# Patient Record
Sex: Female | Born: 2000 | Race: White | Hispanic: No | Marital: Single | Smoking: Never smoker
Health system: Southern US, Community
[De-identification: ages and names within clinical notes are randomized; demographics above are authoritative.]

## PROBLEM LIST (undated history)

## (undated) DIAGNOSIS — K08409 Partial loss of teeth, unspecified cause, unspecified class: Secondary | ICD-10-CM

## (undated) DIAGNOSIS — R5383 Other fatigue: Secondary | ICD-10-CM

## (undated) DIAGNOSIS — F419 Anxiety disorder, unspecified: Secondary | ICD-10-CM

## (undated) DIAGNOSIS — N920 Excessive and frequent menstruation with regular cycle: Secondary | ICD-10-CM

## (undated) DIAGNOSIS — R010 Benign and innocent cardiac murmurs: Secondary | ICD-10-CM

## (undated) DIAGNOSIS — K30 Functional dyspepsia: Secondary | ICD-10-CM

## (undated) HISTORY — DX: Anxiety disorder, unspecified: F41.9

## (undated) HISTORY — DX: Functional dyspepsia: K30

## (undated) HISTORY — PX: IMPACTED THIRD MOLAR REMOVAL: SHX1790

## (undated) HISTORY — DX: Excessive and frequent menstruation with regular cycle: N92.0

## (undated) HISTORY — DX: Benign and innocent cardiac murmurs: R01.0

## (undated) HISTORY — PX: INTRAUTERINE DEVICE (IUD) INSERTION: SHX5877

## (undated) HISTORY — DX: Partial loss of teeth, unspecified cause, unspecified class: K08.409

## (undated) HISTORY — DX: Other fatigue: R53.83

---
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
## 2019-03-03 ENCOUNTER — Ambulatory Visit: Payer: BC Managed Care – PPO | Admitting: Psychiatry

## 2019-05-11 ENCOUNTER — Encounter: Payer: Self-pay | Admitting: Psychiatry

## 2019-05-11 ENCOUNTER — Other Ambulatory Visit: Payer: Self-pay

## 2019-05-11 ENCOUNTER — Ambulatory Visit (INDEPENDENT_AMBULATORY_CARE_PROVIDER_SITE_OTHER): Payer: BC Managed Care – PPO | Admitting: Psychiatry

## 2019-05-11 DIAGNOSIS — F410 Panic disorder [episodic paroxysmal anxiety] without agoraphobia: Secondary | ICD-10-CM | POA: Diagnosis not present

## 2019-05-11 MED ORDER — ALPRAZOLAM 0.5 MG PO TABS: 0.5000 mg | ORAL_TABLET | Freq: Three times a day (TID) | ORAL | 0 refills | Status: DC | PRN

## 2019-05-11 MED ORDER — ESCITALOPRAM OXALATE 5 MG PO TABS: 5.0000 mg | ORAL_TABLET | Freq: Every day | ORAL | 2 refills | Status: DC

## 2019-05-11 NOTE — Progress Notes (Signed)
[facility] MD/PA/NP Initial Note  05/11/2019 8:00 AM [name]  MRN:  [id number] PCP:  Marcene Corning, MD Time Spent:  60 minutes from 0800 to 0900  Chief Complaint:  Chief Complaint    Panic Attack; Anxiety      HPI: Renee Francis is seen onsite in office 60 minutes face-to-face individually with consent with epic collateral for psychiatric diagnostic evaluation with medical services for anxiety disrupting and interrupting her last 5 months of sophomore year of college.  The patient clarifies that she has been treated by a psychiatrist at [facility] but suggests she did not establish therapeutic alliance so she is not confident about BuSpar medication 5 mg 3 times daily, past Prozac 10 mg daily at one time, and Klonopin 0.5 mg twice daily as needed.  The patient does not know the diagnosis and worries about side effects or habituation to use so that she minimizes her medications.  She stopped BuSpar in her fear and may take Klonopin once weekly despite having almost daily out-of-control anxiety.  Mother sees Robley Fries, PhD for therapy in this office and may have taken medication for her anxiety.  Older brother age 12 years is on medication for his anxiety, though patient knows no specifics about either of these 2 relations.  She manifests the most recruitment for her description of having no control over feeling out of control even as she describes interepisodic normality except for an apprehension that she must avoid certain reminders or associations for her last anxiety episodes.  Anxiety has recurred since July recruiting intensity and triggers as well as consequences.  She now feels a sense of combined systems involvement starting somatically as nauseous queasy stomach as though she could lose control and vomit or other excretion.  She may then experience cardiac rate acceleration, and diffuse dissonance as though confused and liable to syncope with thoughts that she could die from the process  suggesting vestibular extension.  She denies triggering trauma or stressors though the themes are generally those of past somatic symptom foci.  She self talks but has no therapist..  Shee denies psychiic or medical trauma in the past.  She has no social anxiety stating she is generally an extrovert except when she experiences these spells of anxiety which seem to be pre or fully established panic.  She does not worry when with family in familiar and secure settings, and she does not describe have generalized anxiety though BuSpar would target such.  She does not describe OCD or other specific phobia.  [place] registry documents 1 fill of Klonopin 03/01/2019.  She has used small amounts of alcohol and caffeine without excess, consequences, mania, delirium, suicidality, or psychosis.  Visit Diagnosis:    ICD-10-CM   1. Panic disorder  F41.0 ALPRAZolam (XANAX) 0.5 MG tablet    escitalopram (LEXAPRO) 5 MG tablet    Past Psychiatric History: Psychiatrist at [facility] apparently prescribing Prozac 10 mg, BuSpar 5 mg, and Klonopin 0.5 mg.  Past Medical History:  Past Medical History:  Diagnosis Date  . Anxiety   . Dyspepsia due to dysmotility   . Fatigue   . Functional heart murmur   . Hypermenorrhea   . Wisdom teeth removed     Past Surgical History:  Procedure Laterality Date  . IMPACTED THIRD MOLAR REMOVAL    . INTRAUTERINE DEVICE (IUD) INSERTION    . LABIAL REDUCTION  2019    Family Psychiatric History: Mother and 29 year old brother have anxiety likely needing medication and mother is in  therapy in this office with PhD.  Family History:  Family History  Problem Relation Age of Onset  . Anxiety disorder Mother   . Anxiety disorder Brother     Social History:  Social History   Socioeconomic History  . Marital status: Single    Spouse name: Not on file  . Number of children: Not on file  . Years of education: Not on file  . Highest education level: Some college, no degree   Occupational History  . Occupation: Consulting civil engineer  Tobacco Use  . Smoking status: Never Smoker  . Smokeless tobacco: Never Used  Substance and Sexual Activity  . Alcohol use: Not Currently  . Drug use: Never  . Sexual activity: Never    Birth control/protection: Pill, I.U.D.  Other Topics Concern  . 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  Her time-limited panic disorder since August is interfering with social and academic life as she is unable to go places or take part in activities that have no bathroom facility available or to be experiencing a sense of loss of control such as riding in the car with other people.  She drinks coffee daily but abstains when feeling prone to panic as they have in her past though she also has heart racing needing self talk feeling lightheaded with thoughts of dying about to get sick and pass out.  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  She has had GI consultation, cardiology consultation for benign heart murmur and venous hum, wisdom teeth extraction or removal, and IUD insertion and check with exam under anesthesia including labial reconstruction.  She tolerated anesthesia well has no substance exposure other than's small amounts of alcohol.  Mother has psychotherapy and possibly medication for anxiety disorders not knowing specifics similar to brother on anxiety medications at age 30 years.   Social Determinants of Health   Financial Resource Strain:   . Difficulty of Paying Living Expenses: Not on file  Food Insecurity:   . Worried About Programme researcher, broadcasting/film/video in the Last Year: Not on file  . Ran Out of Food in the Last Year: Not on file  Transportation Needs:   . Lack of Transportation (Medical): Not on file  . Lack of Transportation (Non-Medical): Not on file  Physical Activity:   .  Days of Exercise per Week: Not on file  . Minutes of Exercise per Session: Not on file  Stress:   . Feeling of Stress : Not on file  Social Connections:   . Frequency of Communication with Friends and Family: Not on file  . Frequency of Social Gatherings with Friends and Family: Not on file  . Attends Religious Services: Not on file  . Active Member of Clubs or Organizations: Not on file  . Attends Banker Meetings: Not on file  . Marital Status: Not on file    Allergies:  Allergies  Allergen Reactions  . Amoxicillin     Metabolic Disorder Labs: No results found for: HGBA1C, MPG No results found for: PROLACTIN No results found for: CHOL, TRIG, HDL, CHOLHDL, VLDL, LDLCALC No results found for: TSH  Therapeutic Level Labs: No results found for: LITHIUM No results found for: VALPROATE No components found for:  CBMZ  Current Medications: Current Outpatient Medications  Medication Sig Dispense Refill  . ALPRAZolam (XANAX) 0.5 MG  tablet Take 1 tablet (0.5 mg total) by mouth 3 (three) times daily as needed for anxiety (Panic). 60 tablet 0  . escitalopram (LEXAPRO) 5 MG tablet Take 1 tablet (5 mg total) by mouth at bedtime. 30 tablet 2  . ibuprofen (ADVIL,MOTRIN) 800 MG tablet Take 800 mg by mouth every 8 (eight) hours as needed.     No current facility-administered medications for this visit.    Medication Side Effects: none  Orders placed this visit:  No orders of the defined types were placed in this encounter.   Psychiatric Specialty Exam:  Review of Systems  Constitutional: Positive for activity change, appetite change and fatigue.  HENT: Positive for dental problem.        Wisdom teeth removed under anesthesia  Eyes: Negative.   Respiratory: Positive for shortness of breath.   Cardiovascular: Positive for chest pain.  Gastrointestinal: Positive for diarrhea and nausea.       Dyspepsia likely associated with dysmotility  Genitourinary: Positive for  menstrual problem.       Kyleena IUD caused excessive menorrhagia requiring birth control pill now with both, no other specific problems reported at puberty.  Musculoskeletal: Negative.   Skin:       Overall topical and enteral meds for acne.  Neurological: Positive for dizziness, weakness, light-headedness and numbness.  Hematological: Negative.   Psychiatric/Behavioral: Positive for confusion, decreased concentration and sleep disturbance. Negative for agitation, behavioral problems, dysphoric mood, hallucinations, self-injury and suicidal ideas. The patient is nervous/anxious. The patient is not hyperactive.     Blood pressure 121/79, pulse 71, height 5\' 5"  (1.651 m), weight 117 lb (53.1 kg).Body mass index is 19.47 kg/m.  Full range of motion cervical spine with no craniofacial dysmorphia.  No neurologic soft signs are evident, and there are no neurocutaneous stigmata.Muscle strengths and tone 5/5, postural reflexes and gait 0/0, and AIMS = 0.  Vestibular functions are intact.  PERRLA 4 mm with EOMs intact.  General Appearance: Casual, Fairly Groomed and Guarded  Eye Contact:  Fair  Speech:  Clear and Coherent, Normal Rate and Talkative  Volume:  Normal  Mood:  Anxious and Euthymic  Affect:  Congruent, Inappropriate, Full Range and Anxious  Thought Process:  Coherent, Goal Directed, Irrelevant and Descriptions of Associations: Tangential  Orientation: Full orienting x4 to person place time and situation  Thought Content: Ilusions, Rumination and Tangential   Suicidal Thoughts:  No  Homicidal Thoughts:  No  Memory:  Immediate;   Good Remote;   Good  Judgement:  Fair  Insight:  Fair  Psychomotor Activity:  Normal, Increased and Mannerisms  Concentration:  Concentration: Fair and Attention Span: Good  Recall:  Good  Fund of Knowledge: Good  Language: Good  Assets:  Desire for Improvement Leisure Time Resilience Vocational/Educational  ADL's:  Intact  Cognition: WNL  Prognosis:   Good   Screenings: Mood disorder questionnaire endorses 3 of 13 items proximate in time serious and problem endorsing that she is at times unable to sleep without really missing it, thoughts racing so she could not slow her mind overthinking, and she became distracted so that concentration was difficult with no manic diathesis or family history  Receiving Psychotherapy: No   Treatment Plan/Recommendations: Psychosupportive psychoeducation mobilizes over 50% of the 60-minute face-to-face time as total 30 minutes spent in counseling and coordination of care with exposure desensitization thought stopping reflex reversal response prevention for behavioral nutrition for weight restoration, sleep hygiene, object relatedness, and desperation management.  She is provided  prevention and monitoring and safety hygiene with warnings and risk for diagnoses and treatment including medications overcoming phobic avoidance to gain confident application including with medication symptom treatment matching.  She discontinues the Klonopin and starts Xanax 0.5 mg 3 times daily as needed for panic or high anxiety of prepanic scribe's #60 with no refill to [facility] [address] for panic disorder.  She agrees to not restart BuSpar but she allows eScription of Lexapro 5 mg every bedtime sent as #30 with 2 refill that will need further titration for panic disorder scribed to [facility] [address].  She returns in 2 to 3 weeks before return to [facility] [employer] preparing for return today and to implement interim accomplishments on return including follow-up as necessary.  Education on therapy is provided which [facility] [employer].    Chauncey MannGlenn E Kavontae Pritchard, MD

## 2019-05-26 ENCOUNTER — Ambulatory Visit (INDEPENDENT_AMBULATORY_CARE_PROVIDER_SITE_OTHER): Payer: BC Managed Care – PPO | Admitting: Psychiatry

## 2019-05-26 ENCOUNTER — Encounter: Payer: Self-pay | Admitting: Psychiatry

## 2019-05-26 ENCOUNTER — Other Ambulatory Visit: Payer: Self-pay

## 2019-05-26 VITALS — Ht 65.0 in | Wt 119.0 lb

## 2019-05-26 DIAGNOSIS — F410 Panic disorder [episodic paroxysmal anxiety] without agoraphobia: Secondary | ICD-10-CM

## 2019-05-26 MED ORDER — ESCITALOPRAM OXALATE 5 MG PO TABS: 5.0000 mg | ORAL_TABLET | Freq: Every day | ORAL | 0 refills | Status: DC

## 2019-05-26 NOTE — Progress Notes (Signed)
[facility]  Patient ID: Renee Francis,  MRN: [id number]  PCP: Lodema Pilot, MD  Date of Evaluation: 05/26/2019 Time spent:20 minutes from 1040 to 1100  Chief Complaint:  Chief Complaint    Panic Attack; Anxiety      HISTORY/CURRENT STATUS: Vale is seen onsite in office 20 minutes face-to-face with consent with epic collateral individually for psychiatric interview and exam in 2-week evaluation and management of panic disorder.  Patient has had approximately 2 panic attacks weekly taking Xanax 0.5 mg sometimes repeating in an hour overall using approximately 5 tablets weekly therefore anticipating she may have 12 weeks of coverage with current supply.  She is taking the Lexapro 5 mg nightly finding the medication to gradually be helping more than previous BuSpar or Prozac but not fully established yet.  She declines to advance to the 10 mg nightly.  She did not talk to her brother about his anxiety over the holiday.  Mother is sad the patient is returning to [place] early but the family otherwise did well at the holiday.  She has no adverse effects otherwise.  She inquires about telemedicine if needed and her desire not to have to use a psychiatric services at [facility].  She discusses the Lexapro dosing times options for her schedule at college. [place] registry documents Xanax dispensing 05/11/2019. She has no mania, suicidality, psychosis or delirium.  Anxiety Presents for initial visit. Onset was 1 to 6 months ago. The problem has been gradually improving. Symptoms include confusion, decreased concentration, dizziness, dry mouth, nausea, nervous/anxious behavior, palpitations and panic. Patient reports no chest pain, compulsions, depressed mood, hyperventilation, insomnia, malaise, obsessions, shortness of breath or suicidal ideas. Symptoms occur constantly. The severity of symptoms is interfering with daily activities, causing significant distress and moderate. The symptoms  are aggravated by family issues, work stress and medication. The quality of sleep is good. Nighttime awakenings: occasional.   Risk factors include change in medication and a major life event. Her past medical history is significant for anxiety/panic attacks. There is no history of arrhythmia, asthma, bipolar disorder, depression, hyperthyroidism or suicide attempts. Past treatments include benzodiazephines, SSRIs and non-benzodiazephine anxiolytics. The treatment provided moderate relief. Compliance with prior treatments has been variable. Prior compliance problems include difficulty with treatment plan and medication issues.    Individual Medical History/ Review of Systems: Changes? :No   Allergies: Amoxicillin  Current Medications:  Current Outpatient Medications:  .  ALPRAZolam (XANAX) 0.5 MG tablet, Take 1 tablet (0.5 mg total) by mouth 3 (three) times daily as needed for anxiety (Panic)., Disp: 60 tablet, Rfl: 0 .  escitalopram (LEXAPRO) 5 MG tablet, Take 1 tablet (5 mg total) by mouth at bedtime., Disp: 90 tablet, Rfl: 0 .  ibuprofen (ADVIL,MOTRIN) 800 MG tablet, Take 800 mg by mouth every 8 (eight) hours as needed., Disp: , Rfl:  Medication Side Effects: none  Family Medical/ Social History: Changes? No  MENTAL HEALTH EXAM:  Height 5\' 5"  (1.651 m), weight 119 lb (54 kg).Body mass index is 19.8 kg/m. Muscle strengths and tone 5/5, postural reflexes and gait 0/0, and AIMS = 0 otherwise deferred for coronavirus shutdown  General Appearance: Casual, Guarded and Well Groomed  Eye Contact:  Good  Speech:  Clear and Coherent, Normal Rate and Talkative  Volume:  Normal  Mood:  Anxious and Euthymic  Affect:  Congruent, Inappropriate, Full Range and Anxious  Thought Process:  Coherent, Goal Directed, Irrelevant and Descriptions of Associations: Circumstantial  Orientation:  Full (Time, Place,  and Person)  Thought Content: Obsessions and Rumination   Suicidal Thoughts:  No  Homicidal  Thoughts:  No  Memory:  Immediate;   Good Remote;   Good  Judgement:  Good  Insight:  Fair  Psychomotor Activity:  Normal, Decreased and Mannerisms  Concentration:  Concentration: Good and Attention Span: Good  Recall:  Good  Fund of Knowledge: Good  Language: Good  Assets:  Resilience Talents/Skills Vocational/Educational  ADL's:  Intact  Cognition: WNL  Prognosis:  Good    DIAGNOSES:    ICD-10-CM   1. Panic disorder  F41.0 escitalopram (LEXAPRO) 5 MG tablet    Receiving Psychotherapy: No    RECOMMENDATIONS: The patient uses as her reference for therapeutic decision her mother's and brother's treatment, but she is independent slow to discuss all issues and decide upon medications. She is thereby conservative and self-reliant requesting only the least medication that may help her overcome the panic attack pattern. She does have some confidence from the last 2 weeks that she can improve, but at  The same time she refuses to titrate the Lexapro from 5 to 10 mg nightly. She is using the Xanax much more effectively than the Klonopin but is also very conservative about Xanax use. Over 50% of the 20-minute face-to-face time is spent in a total of 10 minutes of counseling and coordination of care for exposure response prevention for overcoming phobic avoidance and reducing panic intensity and frequency gradually. She is E scribed Lexapro 5 mg every bedtime sent as #90 [facility] [address] [facility] [place] for panic disorder with the understanding that dose may be advanced to 10 mg nightly whether by her decision in the next several weeks or by phone discussion if necessary. She has Xanax 0.5 mg 3 times daily as needed having number 60 tablets from 2 weeks ago which she expects will cover the next 3 months and declines additional E scription at this time for panic. She returns in 1 to 3 months whether by telemedicine or onsite during spring break or earlier as needed.  Chauncey Mann, MD

## 2019-06-09 ENCOUNTER — Telehealth: Payer: Self-pay | Admitting: Psychiatry

## 2019-06-09 ENCOUNTER — Other Ambulatory Visit: Payer: Self-pay | Admitting: Psychiatry

## 2019-06-09 DIAGNOSIS — F410 Panic disorder [episodic paroxysmal anxiety] without agoraphobia: Secondary | ICD-10-CM

## 2019-06-09 MED ORDER — ALPRAZOLAM 0.5 MG PO TABS: 0.5000 mg | ORAL_TABLET | Freq: Four times a day (QID) | ORAL | 0 refills | Status: DC | PRN

## 2019-06-09 MED ORDER — MIRTAZAPINE 7.5 MG PO TABS: 7.5000 mg | ORAL_TABLET | Freq: Every day | ORAL | 0 refills | Status: DC

## 2019-06-09 NOTE — Telephone Encounter (Signed)
Noted with update on phone calls

## 2019-06-09 NOTE — Telephone Encounter (Signed)
Patient's mom called and said that Renee Francis would like to start takinng the remeron you talked about with her at last visit. Also, she is she is taking 1 to 2 xanax a day which is more than what she told you so she will need a refill on her xanax. [facility] [address] [facility] [place]

## 2019-06-09 NOTE — Telephone Encounter (Signed)
Patient does not answer the phone to debate whether these medications are antianxiety or antidepressant so I clarified to mother who already knows that they are both, and she will again reassure the patient also explaining the higher count of Xanax sent in so patient can collaborate with mother in having confidence in the new treatment plan.

## 2019-06-09 NOTE — Telephone Encounter (Signed)
Pt mom-Donna called back to check status on Rx for daughter Renee Francis she talked with Dr. Marlyne Beards about earlier. Note stated doctor would take care of by the end of the day. After Renee Francis pulled Remeron and Paxil up on web she ask why one is for depression and one for anxiety. Ask nurse to return [name]'s call to explain the difference in the two meds @ [phone]

## 2019-06-09 NOTE — Telephone Encounter (Signed)
Patient  Called and said that she is taking 5 mg of lexapro and is having a side effect. She is having sucidal thoughts. Please call her to tell her what to do

## 2019-06-09 NOTE — Telephone Encounter (Signed)
Mother and patient have decided on Remeron 7.5 mg every bedtime sent as #30 [facility] [address] [facility] [place] along with a new supply of Xanax 0.5 mg 4 times daily #120 with no refill as she has been needing 1 or 2 at a time sometimes several times daily for the panic until the Remeron works as she fears having to come home or give up due to the panic

## 2019-06-09 NOTE — Telephone Encounter (Signed)
Phone call to patient apparently catches her after taking Xanax and going to class so that I returned the other call to mother leaving patient a message for patent I will do so.  Mother can clarify that patient had Covid a couple of months ago and father thinks patient has anxiety from Covid.  Mother recognizes the panic attacks and generalized anxiety as being there prior to the Covid.  The patient saw internist for dyspepsia in October treated with Prilosec.  Her anxiety now has panic of needing to bathroom quickly likely more for diarrhea than vomiting.  She stopped Lexapro 2 days ago and mother interprets that the desperate feeling that she cannot live like this is more the anxiety when Lexapro is not helping at 5 mg either on or off, though they did review the black box warning as though applying only to Lexapro when I clarify for mother that it applies even to her lamotrigine as she did not tolerate other antidepressants.  Mother does well with Xanax as does the patient.  We discussed changing Lexapro now discontinued for 2 days to either Remeron or Paxil mother favoring the Remeron 7.5 mg every bedtime while okay to continue as needed Xanax.  They will leave the pharmacy in [place] area to send the E scription before the end of the office today.

## 2019-06-09 NOTE — Telephone Encounter (Signed)
Mom Lupita Leash called concerned about Renee Francis . A msg was left earlier about the suicidal thoughts . She has taken a Xanax since that msg and is trying to go to class. Mom would like to know how to proceed with her  meds.

## 2019-07-02 ENCOUNTER — Other Ambulatory Visit: Payer: Self-pay | Admitting: Psychiatry

## 2019-07-02 DIAGNOSIS — F410 Panic disorder [episodic paroxysmal anxiety] without agoraphobia: Secondary | ICD-10-CM

## 2019-07-07 ENCOUNTER — Other Ambulatory Visit: Payer: Self-pay | Admitting: Psychiatry

## 2019-07-07 DIAGNOSIS — F410 Panic disorder [episodic paroxysmal anxiety] without agoraphobia: Secondary | ICD-10-CM

## 2019-07-07 NOTE — Telephone Encounter (Signed)
Patient evidently remains at [facility] requesting Xanax be sent there as the bottle of 120 tablets from 06/09/2019 has apparently been used dusting for daily when he had discontinued her Lexapro and was ambivalent about structuring Remeron and Paxil in its place.  Need for follow-up to clarify these issues for patient's wellbeing is suggested as note to pharmacist while lowering the Xanax by 1 tablet daily to 0.5 mg 3 times daily as needed #90 no refill patient having initially been expecting 60 tablets in December to last for 3 months.

## 2019-07-07 NOTE — Telephone Encounter (Signed)
Last apt 04/2019 

## 2019-08-26 ENCOUNTER — Other Ambulatory Visit: Payer: Self-pay | Admitting: Psychiatry

## 2019-08-26 DIAGNOSIS — F410 Panic disorder [episodic paroxysmal anxiety] without agoraphobia: Secondary | ICD-10-CM

## 2019-08-30 NOTE — Telephone Encounter (Signed)
Last apt 04/2019 

## 2019-10-02 ENCOUNTER — Other Ambulatory Visit: Payer: Self-pay | Admitting: Psychiatry

## 2019-10-02 DIAGNOSIS — F410 Panic disorder [episodic paroxysmal anxiety] without agoraphobia: Secondary | ICD-10-CM

## 2019-10-04 NOTE — Telephone Encounter (Signed)
Last apt 05/26/2019 nothing scheduled

## 2019-12-02 ENCOUNTER — Telehealth: Payer: Self-pay | Admitting: Psychiatry

## 2019-12-02 NOTE — Telephone Encounter (Signed)
Pt having eye surgery tomorrow. Was given 0.5 mg Xanax to take prior to apt. Pt already taking Xanax dose more than given.Advise how much should she take for tomorrow. Call [phone] or [phone] to advise

## 2019-12-02 NOTE — Telephone Encounter (Signed)
Patient or mother left 2 numbers expecting a call back with no answer at the first but mother at the second number.  Mother expects the discussion to decide what time and how much Xanax the patient takes before she goes to the ophthalmology office for eye surgery having her eye cut when awake about which she is very stressed according to mother.  Mother indicates the patient has taken up to 4 tabs of the 0.5 mg Xanax for panic attacks at college but no  longer takes 1 every morning before class so she can go to class.  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  Mother concludes she may take 1.5 mg of Xanax around 1 PM  thengoing to the office at 1:30 PM mother transporting and then  the surgery at 2 PM for which  the office staff can help titrate Xanax dosing at surgery.  I clarify that last  appointment being December 30 will not allow me to prescribe any more Xanax for her without appointment.

## 2020-01-04 ENCOUNTER — Ambulatory Visit (INDEPENDENT_AMBULATORY_CARE_PROVIDER_SITE_OTHER): Payer: BC Managed Care – PPO | Admitting: Psychiatry

## 2020-01-04 ENCOUNTER — Encounter: Payer: Self-pay | Admitting: Psychiatry

## 2020-01-04 ENCOUNTER — Other Ambulatory Visit: Payer: Self-pay

## 2020-01-04 VITALS — Ht 65.0 in | Wt 119.0 lb

## 2020-01-04 DIAGNOSIS — F411 Generalized anxiety disorder: Secondary | ICD-10-CM | POA: Diagnosis not present

## 2020-01-04 DIAGNOSIS — F410 Panic disorder [episodic paroxysmal anxiety] without agoraphobia: Secondary | ICD-10-CM

## 2020-01-04 MED ORDER — PAROXETINE HCL 20 MG PO TABS: 20.0000 mg | ORAL_TABLET | Freq: Every day | ORAL | 0 refills | Status: DC

## 2020-01-04 MED ORDER — PAROXETINE HCL 20 MG PO TABS: 20.0000 mg | ORAL_TABLET | Freq: Every day | ORAL | 3 refills | Status: DC

## 2020-01-04 MED ORDER — ALPRAZOLAM 0.5 MG PO TABS: 0.5000 mg | ORAL_TABLET | Freq: Two times a day (BID) | ORAL | 2 refills | Status: AC | PRN

## 2020-01-04 NOTE — Progress Notes (Signed)
[facility]  Patient ID: Renee Francis,  MRN: [id number]  PCP: Renee Corning, MD  Date of Evaluation: 01/04/2020 Time spent:25 minutes from 1520 to 1545  Chief Complaint:  Chief Complaint    Panic Attack; Anxiety      HISTORY/CURRENT STATUS: Renee Francis is seen onsite in office 25 minutes face-to-face individually with consent with epic collateral for psychiatric interview and exam in 19-month evaluation and management of panic disorder with more clear conclusion over time of also generalized anxiety.  Patient is anxious about having any problems as well as about taking any treatment.  She has been unsuccessful with Klonopin, BuSpar, and Prozac prior to first being seen here.  She was seen twice before leaving for her spring semester last December taking Lexapro 5 mg and Xanax 0.5 mg.  She called back in January with mother's call and support as well in early February becoming willing to take Remeron 7.5 mg nightly in place of Lexapro she had discontinued, while continuing the Xanax but did not start Paxil at that time.  She now states that Remeron has helped her anxiety more than anything other than the Xanax, but she is having apparent side effects on the Remeron of sad dreams and having progressively more of these dreams.  She takes 0-2 Xanax daily except taking 4 before her eye surgery this summer that worked adequately, also receiving some Ambien from the ophthalmologist as per [place] registry on 12/01/2019.  Her last [place] Registry dispensing of the Xanax was 11/30/2019.  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 She requests the new prescription to replace the Remeron locally and then to have all the [facility] [place].  She has no mania, suicidality, psychosis or delirium.  She uses no alcohol  now although she has used modestly in the past, and she does take her birth control pill.  Anxiety  Presents for follow up visit for onset 1 year ago of excessive worry and panic problem now gradually improving. Symptoms include excessive worry, decreased concentration, insomnia, dizziness, dry mouth, nausea, nervous/anxious behavior, palpitations and panic. Patient reports no chest pain, compulsions, depressed mood, confusion, hyperventilation, malaise, obsessions, shortness of breath or suicidal ideas. Symptoms occur constantly. The severity of symptoms is interfering with daily activities, causing significant distress and moderate. The symptoms are aggravated by family issues, work stress and medication. The quality of sleep is good. Nighttime awakenings: occasional. Risk factors include change in medication and a major life event. Her past medical history is significant for anxiety/panic attacks. There is no history of arrhythmia, asthma, bipolar disorder, depression, hyperthyroidism or suicide attempts. Past treatments include benzodiazephines, SSRIs and non-benzodiazephine anxiolytics. The treatment provided moderate relief. Compliance with prior treatments has been variable. Prior compliance problems include difficulty with treatment plan and medication issues.   Individual Medical History/ Review of Systems: Changes? :Yes Weight unchanged having [facility] in the [facility] and taking birth control pill.  Allergies: Amoxicillin  Current Medications:  Current Outpatient Medications:  .  ALPRAZolam (XANAX) 0.5 MG tablet, Take 1 tablet (0.5 mg total) by mouth 2 (two) times daily as needed for anxiety (Panic)., Disp: 60 tablet, Rfl: 2 .  ibuprofen (ADVIL,MOTRIN) 800 MG tablet, Take 800 mg by mouth every 8 (eight) hours as needed., Disp: ,  Rfl:  .  PARoxetine (PAXIL) 20 MG tablet, Take 1 tablet (20 mg total) by mouth at bedtime., Disp: 30 tablet, Rfl: 0 .   PARoxetine (PAXIL) 20 MG tablet, Take 1 tablet (20 mg total) by mouth at bedtime., Disp: 30 tablet, Rfl: 3  Medication Side Effects: sad dreams from Remeron  Family Medical/ Social History: Changes? Yes brother has become patient here briefly  MENTAL HEALTH EXAM:  Height 5\' 5"  (1.651 m), weight 119 lb (54 kg).Body mass index is 19.8 kg/m. Muscle strengths and tone 5/5, postural reflexes and gait 0/0, and AIMS = 0.  General Appearance: Casual, Guarded, Meticulous and Well Groomed  Eye Contact:  Fair  Speech:  Clear and Coherent and Normal Rate  Volume:  Normal  Mood:  Anxious and Euthymic  Affect:  Congruent, Inappropriate, Restricted and Anxious  Thought Process:  Coherent, Goal Directed, Irrelevant and Descriptions of Associations: Circumstantial  Orientation:  Full (Time, Place, and Person)  Thought Content: Obsessions and Rumination   Suicidal Thoughts:  No  Homicidal Thoughts:  No  Memory:  Immediate;   Good Remote;   Good  Judgement:  Fair  Insight:  Fair  Psychomotor Activity:  Normal and Mannerisms  Concentration:  Concentration: Fair and Attention Span: Good  Recall:  Fair  Fund of Knowledge: Good  Language: Good  Assets:  Desire for Improvement Leisure Time Resilience Vocational/Educational  ADL's:  Intact  Cognition: WNL  Prognosis:  Good    DIAGNOSES:    ICD-10-CM   1. Panic disorder  F41.0 PARoxetine (PAXIL) 20 MG tablet    PARoxetine (PAXIL) 20 MG tablet    ALPRAZolam (XANAX) 0.5 MG tablet  2. Generalized anxiety disorder  F41.1 PARoxetine (PAXIL) 20 MG tablet    PARoxetine (PAXIL) 20 MG tablet    Receiving Psychotherapy: No    RECOMMENDATIONS: Patient is provided psychoeducation supportively with cognitive behavioral sleep hygiene, social problem-solving, and frustration management interventions coordinated with symptom treatment matching for medication prevention and monitoring safety hygiene.  Remeron is discontinued for nightmares as Paxil is  started 20 mg tablet [facility] 1 month with next fills to follow to [facility] in [place] to initially take 1/2 tablet nightly for 4 to 8 days then advance to 1 nightly as #30 with 3 refills in [place] for generalized anxiety and panic.  She is E scribed Xanax 0.5 mg twice daily as needed for panic as #30 with [facility]ills [facility] [address] [facility] [place].  She expects follow-up in 5 months though knowing she can consider telemedicine in the interim as needed.   [place], MD

## 2020-01-11 ENCOUNTER — Telehealth: Payer: Self-pay | Admitting: Psychiatry

## 2020-01-11 NOTE — Telephone Encounter (Signed)
Phone call from patient is returned reaching her but called being dropped calling back completing the discussion.  The patient feels and functions reasonably well now but does not like the side effect of numbing the loss of motivation after 7 days of Paxil now 10 mg nightly.  She discusses the option of returning to Remeron as one half of the 7.5 mg tablet to see if the sad dreams go away but she still gets relief of anxiety.  She is educated that though the Paxil does not seem to be helping much yet, the side effects may get better in the next few weeks and efficacy may get better as well.  She has as needed Xanax for panic and allows discussion of how she still busy with college that she feels she cannot spare the time to have panic.  She establishes conclusion to stop Paxil and start Remeron 3.75 mg nightly if needed otherwise to try as needed Xanax for control of her panic knowing the availability of appointment onsite or by telemedicine here or even just another phone call.

## 2020-01-11 NOTE — Telephone Encounter (Signed)
Pt called and has been taking Paroxetine. Since taking its made her numb and unmotivated. She is asking if she can stop taking these. [phone]

## 2020-01-26 ENCOUNTER — Other Ambulatory Visit: Payer: Self-pay | Admitting: Psychiatry

## 2020-01-26 DIAGNOSIS — F410 Panic disorder [episodic paroxysmal anxiety] without agoraphobia: Secondary | ICD-10-CM

## 2020-01-26 DIAGNOSIS — F411 Generalized anxiety disorder: Secondary | ICD-10-CM

## 2020-03-15 ENCOUNTER — Encounter: Payer: Self-pay | Admitting: Psychiatry

## 2020-04-11 ENCOUNTER — Telehealth: Payer: Self-pay | Admitting: Psychiatry

## 2020-04-11 NOTE — Telephone Encounter (Signed)
Pt called to report she has tried to get off all meds for past month. No longer taking Paxil,was feeling numb. Would like to know what you think about her trying Bupropion? Contact # [phone]

## 2020-04-11 NOTE — Telephone Encounter (Signed)
Please review

## 2020-04-11 NOTE — Telephone Encounter (Signed)
Phone call returned to patient at her request as she reports stopping Paxil as anxiety better and feeling numb from the medication she now worries that the anxiety is coming back.  She also reports starting a birth control pill which makes her unmotivated and possibly depressive that she has lived up requesting me to prescribe Wellbutrin for the symptoms asking if it will help panic anxiety.  I clarified that Wellbutrin may make the panic worse so it is an antidepressant that can motivation and help generalized anxiety somewhat.  She asked what medication might be the best one to help her anxiety and other symptoms.  I clarified the best first step is to ask her prescribing doctor to adjust the birth control pill components to alleviate the depressive and unmotivated symptoms.  I suggest Pamelor might be an option for the anxiety.  She clarifies to me that she knows from her brother that my retirement in January will her before she would be returning for follow-up here.  She concludes she will seeing a new psychiatric provider in [place] and will address these questions, issues, and options them as she inquires about in that practice request her records from this office reassuring her will be provided thereby with her consent.  She concludes she is planning the right care currently and therefore may have less anxiety.

## 2020-04-11 NOTE — Addendum Note (Signed)
Addended by: Chauncey Mann on: 04/11/2020 06:26 PM   Modules accepted: Orders

## 2021-06-03 ENCOUNTER — Ambulatory Visit: Payer: BLUE CROSS/BLUE SHIELD
